# Patient Record
Sex: Male | Born: 1975 | Race: White | Hispanic: No | Marital: Married | Smoking: Current every day smoker
Health system: Southern US, Community
[De-identification: ages and names within clinical notes are randomized; demographics above are authoritative.]

## PROBLEM LIST (undated history)

## (undated) HISTORY — PX: HERNIA REPAIR: SHX51

---

## 2016-10-26 ENCOUNTER — Emergency Department: Payer: Self-pay

## 2016-10-26 ENCOUNTER — Emergency Department
Admission: EM | Admit: 2016-10-26 | Discharge: 2016-10-26 | Disposition: A | Discharge status: Home / Self Care | Payer: Self-pay | Attending: Emergency Medicine | Admitting: Emergency Medicine

## 2016-10-26 ENCOUNTER — Encounter: Payer: Self-pay | Admitting: Emergency Medicine

## 2016-10-26 DIAGNOSIS — F17210 Nicotine dependence, cigarettes, uncomplicated: Secondary | ICD-10-CM | POA: Insufficient documentation

## 2016-10-26 DIAGNOSIS — R110 Nausea: Secondary | ICD-10-CM | POA: Insufficient documentation

## 2016-10-26 DIAGNOSIS — R519 Headache, unspecified: Secondary | ICD-10-CM

## 2016-10-26 DIAGNOSIS — R51 Headache: Secondary | ICD-10-CM | POA: Insufficient documentation

## 2016-10-26 DIAGNOSIS — I10 Essential (primary) hypertension: Secondary | ICD-10-CM | POA: Insufficient documentation

## 2016-10-26 LAB — CBC WITH DIFFERENTIAL/PLATELET
Basophils Absolute: 0 10*3/uL (ref 0–0.1)
Basophils Relative: 0 %
EOS PCT: 2 %
Eosinophils Absolute: 0.2 10*3/uL (ref 0–0.7)
HCT: 43.2 % (ref 40.0–52.0)
Hemoglobin: 14.4 g/dL (ref 13.0–18.0)
LYMPHS ABS: 0.9 10*3/uL — AB (ref 1.0–3.6)
Lymphocytes Relative: 11 %
MCH: 29.4 pg (ref 26.0–34.0)
MCHC: 33.3 g/dL (ref 32.0–36.0)
MCV: 88.4 fL (ref 80.0–100.0)
MONO ABS: 0.5 10*3/uL (ref 0.2–1.0)
Monocytes Relative: 7 %
Neutro Abs: 6.6 10*3/uL — ABNORMAL HIGH (ref 1.4–6.5)
Neutrophils Relative %: 80 %
PLATELETS: 210 10*3/uL (ref 150–440)
RBC: 4.88 MIL/uL (ref 4.40–5.90)
RDW: 13.9 % (ref 11.5–14.5)
WBC: 8.2 10*3/uL (ref 3.8–10.6)

## 2016-10-26 LAB — BASIC METABOLIC PANEL
Anion gap: 8 (ref 5–15)
BUN: 22 mg/dL — AB (ref 6–20)
CHLORIDE: 106 mmol/L (ref 101–111)
CO2: 26 mmol/L (ref 22–32)
CREATININE: 0.82 mg/dL (ref 0.61–1.24)
Calcium: 9.1 mg/dL (ref 8.9–10.3)
GFR calc Af Amer: >60 mL/min (ref >60)
GFR calc non Af Amer: >60 mL/min (ref >60)
GLUCOSE: 106 mg/dL — AB (ref 65–99)
Potassium: 3.7 mmol/L (ref 3.5–5.1)
SODIUM: 140 mmol/L (ref 135–145)

## 2016-10-26 LAB — GLUCOSE, CAPILLARY: Glucose-Capillary: 112 mg/dL — ABNORMAL HIGH (ref 65–99)

## 2016-10-26 LAB — TSH: TSH: 0.954 u[IU]/mL (ref 0.350–4.500)

## 2016-10-26 MED ORDER — KETOROLAC TROMETHAMINE 30 MG/ML IJ SOLN
30.0000 mg | Freq: Once | INTRAMUSCULAR | Status: AC
  Administered 2016-10-26: 30 mg via INTRAVENOUS
  Filled 2016-10-26: qty 1

## 2016-10-26 MED ORDER — SODIUM CHLORIDE 0.9 % IV BOLUS (SEPSIS)
1000.0000 mL | Freq: Once | INTRAVENOUS | Status: AC
  Administered 2016-10-26: 1000 mL via INTRAVENOUS

## 2016-10-26 MED ORDER — METOPROLOL TARTRATE 25 MG PO TABS
25.0000 mg | ORAL_TABLET | Freq: Once | ORAL | Status: AC
  Administered 2016-10-26: 25 mg via ORAL
  Filled 2016-10-26: qty 1

## 2016-10-26 MED ORDER — METOPROLOL TARTRATE 25 MG PO TABS: 25.0000 mg | ORAL_TABLET | Freq: Two times a day (BID) | ORAL | 0 refills | Status: AC

## 2016-10-26 NOTE — ED Notes (Signed)
Pt returned to ED Rm 19 from CT at this time. 

## 2016-10-26 NOTE — ED Notes (Signed)
E sig not working.  Pt verbalized understanding of all d/c instructions.  Pt sent home with d/c papers and RX for BP meds.

## 2016-10-26 NOTE — ED Notes (Signed)
Pt reports h/x of HTN; reports he stopped taking BP meds about 4 years ago.

## 2016-10-26 NOTE — ED Notes (Signed)
Patient transported to CT at this time. 

## 2016-10-26 NOTE — ED Provider Notes (Signed)
[facility] Emergency Department Provider Note  ____________________________________________  Time seen: Approximately 6:39 AM  I have reviewed the triage vital signs and the nursing notes.   HISTORY  Chief Complaint Headache    HPI Nathan Moss is a 41 y.o. male who complains of bilateral frontal and retro-orbital headache for the past month or 2. He reports he gets the headache about every other day. Usually comes on when he hasn't eaten for several hours and then gets better after he eats. No vision changes. No numbness tingling weakness dizziness or syncope. Not positional. Better in the morning, gets worse as the day goes on. No trauma. No fevers chills night pain or stiffness. Headaches are intermittent, moderate intensity. No aggravating factors that his nose. Feels like pressure    History reviewed. No pertinent past medical history. Hypertension, off medication for 4 years  There are no active problems to display for this patient.    Past Surgical History:  Procedure Laterality Date  . HERNIA REPAIR       Prior to Admission medications   Medication Sig Start Date End Date Taking? Authorizing Provider  metoprolol tartrate (LOPRESSOR) 25 MG tablet Take 1 tablet (25 mg total) by mouth 2 (two) times daily. 10/26/16 10/26/17  Rockne Menghini, MD  None   Allergies Patient has no known allergies.   No family history on file.  Social History Social History  Substance Use Topics  . Smoking status: Current Every Day Smoker    Types: Cigarettes  . Smokeless tobacco: Never Used  . Alcohol use Not on file    Review of Systems  Constitutional:   No fever or chills.  ENT:   No sore throat. No rhinorrhea. Lymphatic: No swollen glands, No extremity swelling Endocrine: No hot/cold flashes. No significant weight change. No neck swelling.Positive for frequent thirst Cardiovascular:   No chest pain or syncope. Respiratory:   No dyspnea or  cough. Gastrointestinal:   Negative for abdominal pain, vomiting and diarrhea.  Genitourinary:   Negative for dysuria or difficulty urinating. Positive for excessive urination Musculoskeletal:   Negative for focal pain or swelling Neurological:   Positive as above for headache without focal weakness or paresthesia. All other systems reviewed and are negative except as documented above in ROS and HPI.  ____________________________________________   PHYSICAL EXAM:  VITAL SIGNS: ED Triage Vitals  Enc Vitals Group     BP 10/26/16 0552 (!) 170/123     Pulse Rate 10/26/16 0552 (!) 106     Resp 10/26/16 0552 20     Temp 10/26/16 0552 98.8 F (37.1 C)     Temp Source 10/26/16 0552 Oral     SpO2 10/26/16 0552 98 %     Weight 10/26/16 0553 185 lb (83.9 kg)     Height 10/26/16 0553  (1.753 m)     Head Circumference --      Peak Flow --      Pain Score 10/26/16 0552 10     Pain Loc --      Pain Edu? --      Excl. in GC? --     Vital signs reviewed, nursing assessments reviewed.   Constitutional:   Alert and oriented. Well appearing and in no distress. Eyes:   No scleral icterus. No conjunctival pallor. PERRL. EOMI.  No nystagmus. ENT   Head:   Normocephalic and atraumatic.   Nose:   No congestion/rhinnorhea. No septal hematoma   Mouth/Throat:   MMM, no  pharyngeal erythema. No peritonsillar mass.    Neck:   No stridor. No SubQ emphysema. No meningismus. Hematological/Lymphatic/Immunilogical:   No cervical lymphadenopathy. Cardiovascular:   RRR rate 90. Symmetric bilateral radial and DP pulses.  No murmurs.  Respiratory:   Normal respiratory effort without tachypnea nor retractions. Breath sounds are clear and equal bilaterally. No wheezes/rales/rhonchi. Gastrointestinal:   Soft and nontender. Non distended. There is no CVA tenderness.  No rebound, rigidity, or guarding. Genitourinary:   deferred Musculoskeletal:   Normal range of motion in all extremities. No joint  effusions.  No lower extremity tenderness.  No edema. Neurologic:   Normal speech and language.  CN 2-10 normal. Motor grossly intact. Normal gait. Normal cerebellar function No gross focal neurologic deficits are appreciated.  Skin:    Skin is warm, dry and intact. No rash noted.  No petechiae, purpura, or bullae.  ____________________________________________    LABS (pertinent positives/negatives) (all labs ordered are listed, but only abnormal results are displayed) Labs Reviewed  BASIC METABOLIC PANEL - Abnormal; Notable for the following:       Result Value   Glucose, Bld 106 (*)    BUN 22 (*)    All other components within normal limits  CBC WITH DIFFERENTIAL/PLATELET - Abnormal; Notable for the following:    Neutro Abs 6.6 (*)    Lymphs Abs 0.9 (*)    All other components within normal limits  GLUCOSE, CAPILLARY - Abnormal; Notable for the following:    Glucose-Capillary 112 (*)    All other components within normal limits  TSH   ____________________________________________   EKG  Interpreted by me  Date: 10/26/2016  Rate: 88  Rhythm: normal sinus rhythm  QRS Axis: normal  Intervals: normal  ST/T Wave abnormalities: normal  Conduction Disutrbances: none  Narrative Interpretation: unremarkable      ____________________________________________    RADIOLOGY  No results found.  ____________________________________________   PROCEDURES Procedures  ____________________________________________   INITIAL IMPRESSION / ASSESSMENT AND PLAN / ED COURSE  Pertinent labs & imaging results that were available during my care of the patient were reviewed by me and considered in my medical decision making (see chart for details).  Patient well appearing no acute distress, presents with new headache pattern going on for a month or 2. No associated neurologic deficits by history or exam. No evidence of infection. No evidence of trauma. Fingerstick glucose is  normal. Considering the patient's symptoms, medical history, and physical examination today, I have low suspicion for ischemic stroke, intracranial hemorrhage, meningitis, encephalitis, carotid or vertebral dissection, venous sinus thrombosis, MS, intracranial hypertension, glaucoma, CRAO, CRVO, or temporal arteritis.  I'll obtain a CT head to rule out a structural lesion, check labs. If workup negative, I would discharge the patient home with a prescription for Norvasc for his hypertension.        ____________________________________________   FINAL CLINICAL IMPRESSION(S) / ED DIAGNOSES  Final diagnoses:  Chronic nonintractable headache, unspecified headache type  Essential hypertension      Discharge Medication List as of 10/26/2016  7:39 AM    START taking these medications   Details  metoprolol tartrate (LOPRESSOR) 25 MG tablet Take 1 tablet (25 mg total) by mouth 2 (two) times daily., Starting Tue 10/26/2016, Until Wed 10/26/2017, Print         Portions of this note were generated with dragon dictation software. Dictation errors may occur despite best attempts at proofreading.    Sharman Cheek, MD 11/06/16 2059

## 2016-10-26 NOTE — ED Triage Notes (Addendum)
Pt presents to ED with severe headache. Pt states his head has been hurting for the past month but got worse yesterday. Pt reports nausea but denies vomiting. Pt alert ambulatory with steady gait. no distress noted. Elevated blood pressure with no hx of the same.

## 2016-10-26 NOTE — ED Provider Notes (Signed)
This patient was signed out to me by Dr. Alfonse Flavors. 41 year old male presenting for headache. He has a previous diagnosis of hypertension, and took one month of antihypertensives 4 years ago and has not followed up since. Overall, the patient's workup in the emergency department is reassuring. His vital signs have improved, his CT head does not show any acute intracranial process, his laboratory studies are reassuring. At this time, I will treat the patient with Toradol, and discharge him home with a prescription for metoprolol. He understands that he needs to establish a primary care physician I have given him the contact information for the [place] [facility]. He understands return precautions as well as follow-up instructions.   Rockne Menghini, MD 10/26/16 [phone]

## 2016-10-26 NOTE — Discharge Instructions (Addendum)
Please restart your blood pressure medication, metoprolol.  Record your blood pressure once daily and bring the record with you to your primary care doctor appointment.  Return to the emergency department for severe pain, vision or speech changes, fever, vomiting, numbness, tingling or weakness, or any other symptoms concerning to you.

## 2018-05-02 IMAGING — CT CT HEAD W/O CM
3 series · 15 of 46 positions shown, 18 images · non-contrast
Comparison: None.

CLINICAL DATA: Initial evaluation for recurrent headache for 1
month.

EXAM:
CT HEAD WITHOUT CONTRAST
TECHNIQUE: Contiguous axial images were obtained from the base of the skull
through the vertex without intravenous contrast.

[Series 2: head wo · axial · 0.41mm/px · z∈[-146,-26]mm · 9 of 29 slices shown, 12 images]
[im 3/29  brain]
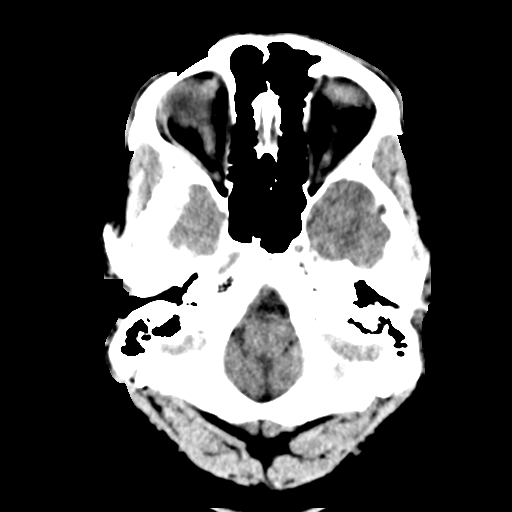
[im 3/29  bone]
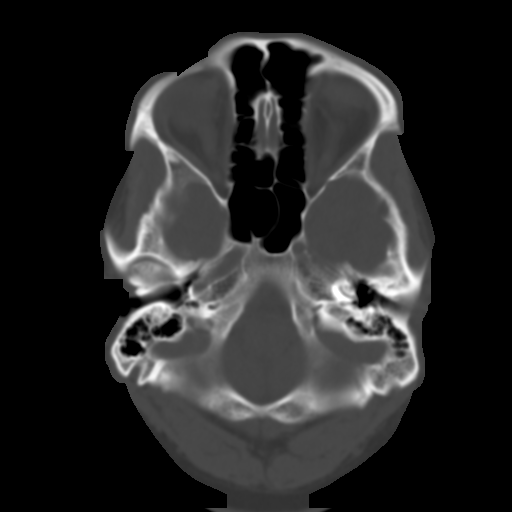
[im 6/29  brain]
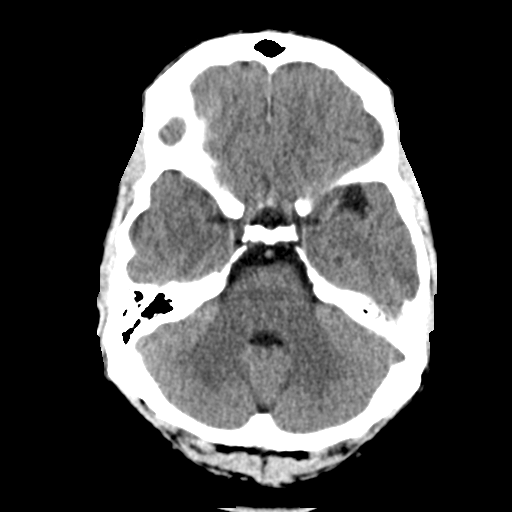
[im 9/29  brain]
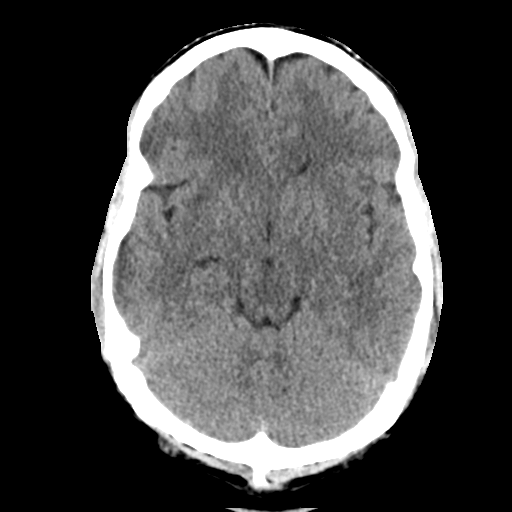
[im 12/29  brain]
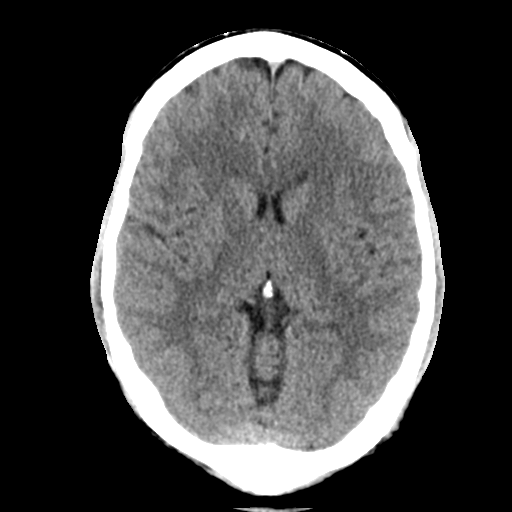
[im 15/29  brain]
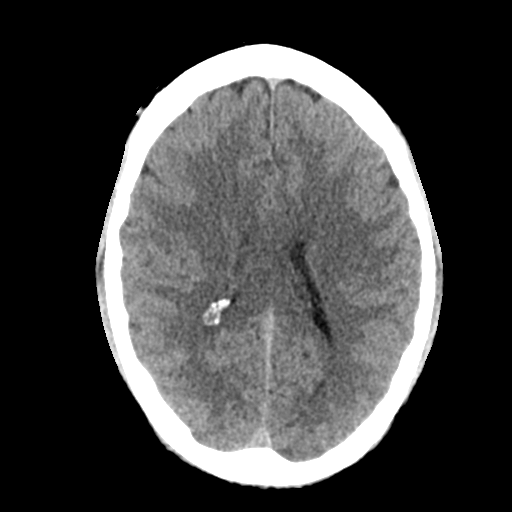
[im 15/29  bone]
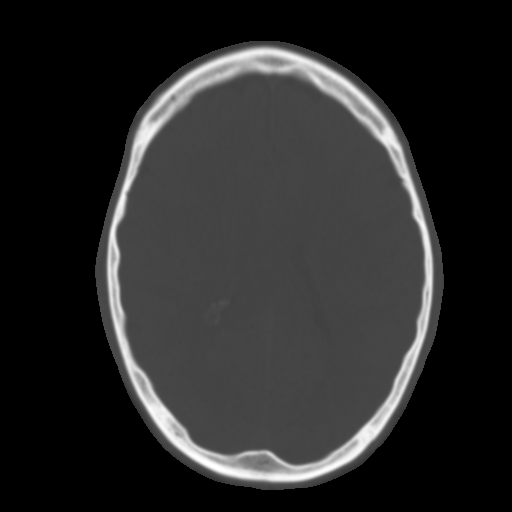
[im 18/29  brain]
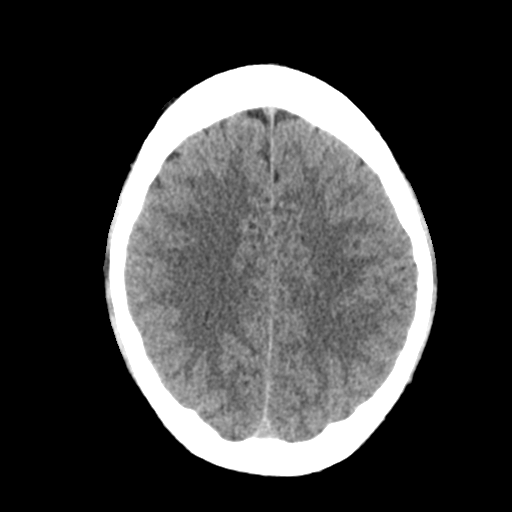
[im 21/29  brain]
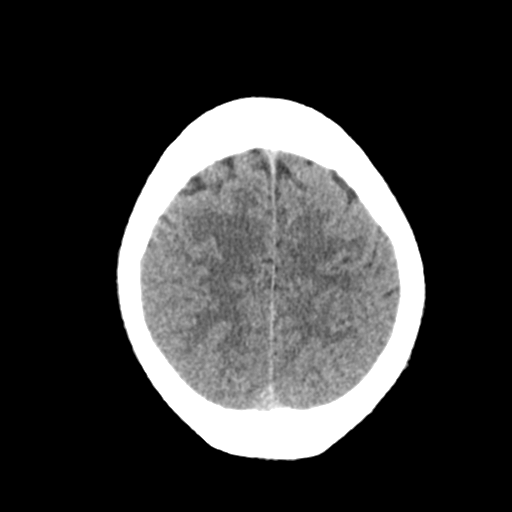
[im 24/29  brain]
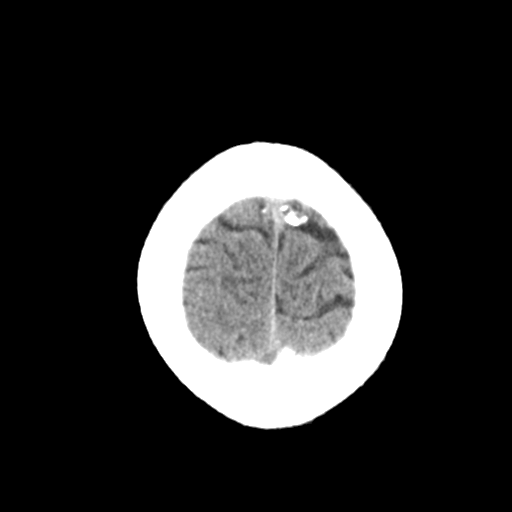
[im 27/29  brain]
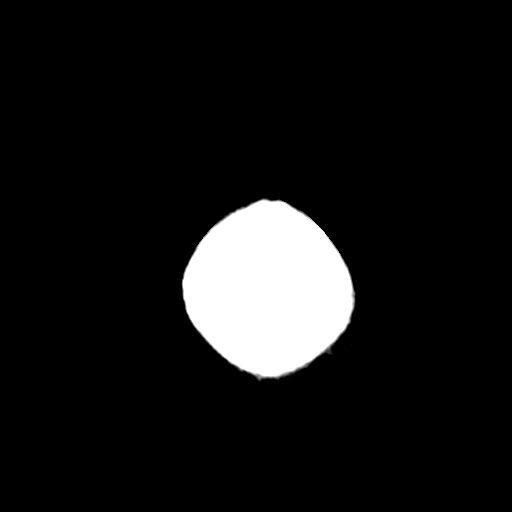
[im 27/29  bone]
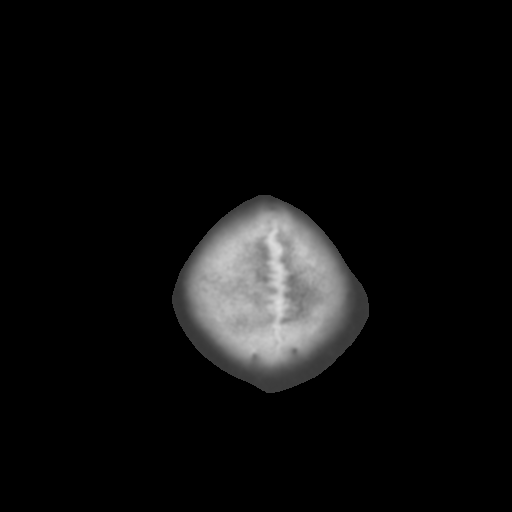

[Series 4: coronal soft tissue · coronal · 0.29mm/px · 3 of 66 slices shown]
[im 22/66  brain]
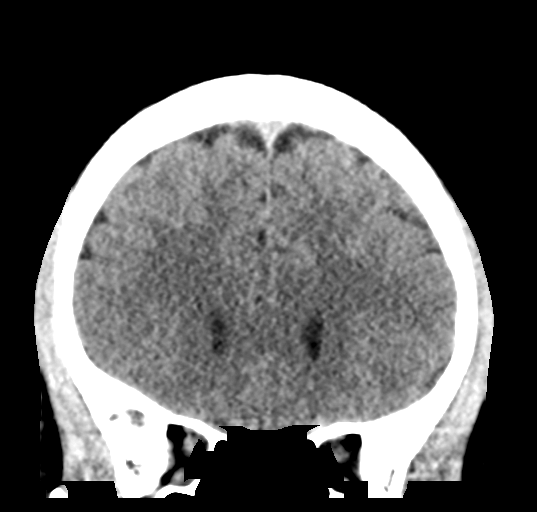
[im 29/66  brain]
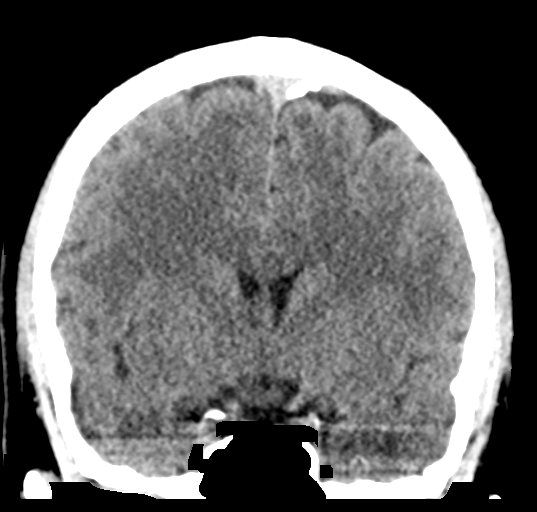
[im 37/66  brain]
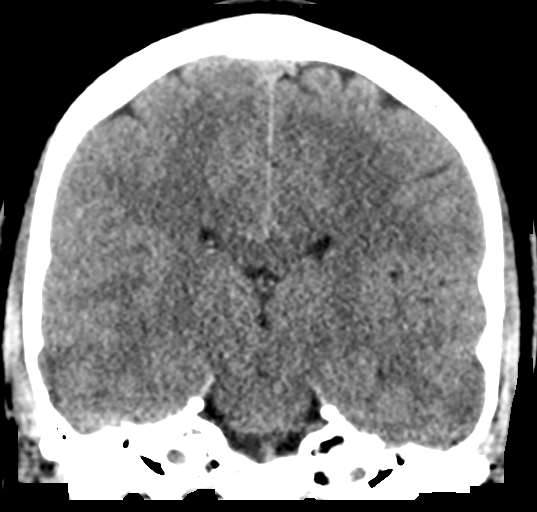

[Series 5: sagittal soft tissue · sagittal · 0.29mm/px · 3 of 52 slices shown]
[im 18/52  brain]
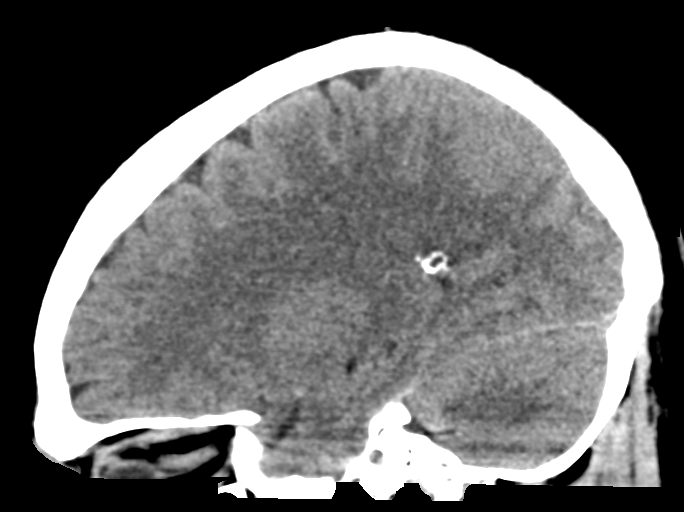
[im 26/52  brain]
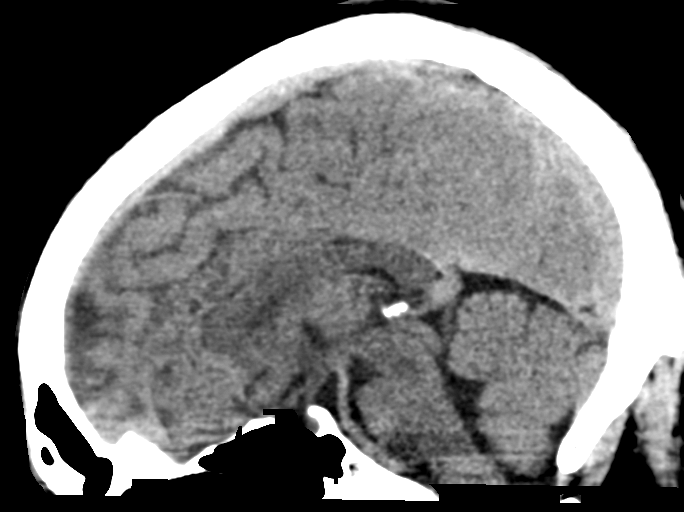
[im 35/52  brain]
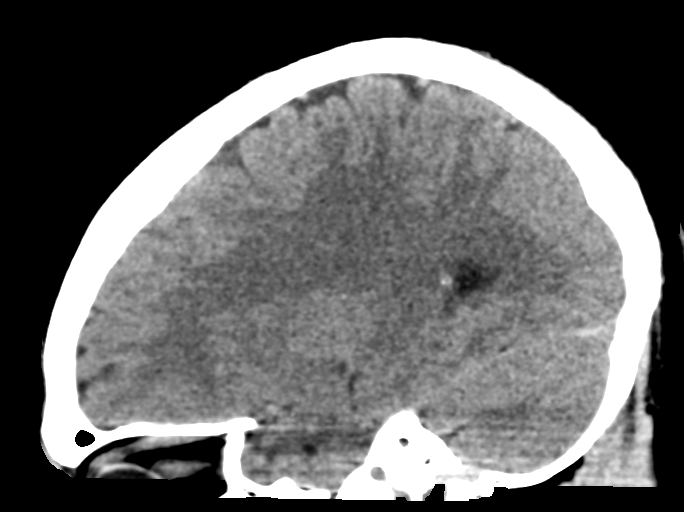

[15 of 46 positions shown; findings below may reference images not displayed]

FINDINGS: Brain: Cerebral volume within normal limits for patient age.

No evidence for acute intracranial hemorrhage. No findings to
suggest acute large vessel territory infarct. No mass lesion,
midline shift, or mass effect. Ventricles are normal in size without
evidence for hydrocephalus. No extra-axial fluid collection
identified.

Vascular: No hyperdense vessel identified.

Skull: Scalp soft tissues demonstrate no acute abnormality.Calvarium
intact.

Sinuses/Orbits: Globes and orbital soft tissues are within normal
limits.

Visualized paranasal sinuses are clear. No mastoid effusion.
IMPRESSION: Normal head CT.  No acute intracranial process identified.

## 2019-02-05 ENCOUNTER — Other Ambulatory Visit: Payer: Self-pay | Admitting: Gastroenterology

## 2019-02-05 DIAGNOSIS — R7401 Elevation of levels of liver transaminase levels: Secondary | ICD-10-CM

## 2019-02-09 ENCOUNTER — Other Ambulatory Visit: Payer: Self-pay

## 2019-02-09 ENCOUNTER — Ambulatory Visit
Admission: RE | Admit: 2019-02-09 | Discharge: 2019-02-09 | Disposition: A | Discharge status: Home / Self Care | Payer: 59 | Source: Ambulatory Visit | Attending: Gastroenterology | Admitting: Gastroenterology

## 2019-02-09 DIAGNOSIS — R740 Nonspecific elevation of levels of transaminase and lactic acid dehydrogenase [LDH]: Secondary | ICD-10-CM | POA: Insufficient documentation

## 2019-02-09 DIAGNOSIS — R7401 Elevation of levels of liver transaminase levels: Secondary | ICD-10-CM

## 2019-02-10 ENCOUNTER — Other Ambulatory Visit: Payer: Self-pay

## 2019-02-10 DIAGNOSIS — Z20822 Contact with and (suspected) exposure to covid-19: Secondary | ICD-10-CM

## 2019-02-11 LAB — NOVEL CORONAVIRUS, NAA: SARS-CoV-2, NAA: NOT DETECTED
